# Patient Record
Sex: Female | Born: 1950
Health system: Southern US, Community
[De-identification: ages and names within clinical notes are randomized; demographics above are authoritative.]

## PROBLEM LIST (undated history)

## (undated) DIAGNOSIS — I2699 Other pulmonary embolism without acute cor pulmonale: Secondary | ICD-10-CM

## (undated) HISTORY — PX: OTHER SURGICAL HISTORY: SHX169

## (undated) HISTORY — PX: ABDOMINAL HYSTERECTOMY: SHX81

---

## 2014-10-10 ENCOUNTER — Emergency Department (HOSPITAL_BASED_OUTPATIENT_CLINIC_OR_DEPARTMENT_OTHER)
Admission: EM | Admit: 2014-10-10 | Discharge: 2014-10-10 | Disposition: A | Discharge status: Home / Self Care | Payer: Self-pay | Attending: Emergency Medicine | Admitting: Emergency Medicine

## 2014-10-10 ENCOUNTER — Emergency Department (HOSPITAL_BASED_OUTPATIENT_CLINIC_OR_DEPARTMENT_OTHER): Payer: Self-pay

## 2014-10-10 ENCOUNTER — Encounter (HOSPITAL_BASED_OUTPATIENT_CLINIC_OR_DEPARTMENT_OTHER): Payer: Self-pay | Admitting: *Deleted

## 2014-10-10 DIAGNOSIS — M542 Cervicalgia: Secondary | ICD-10-CM | POA: Insufficient documentation

## 2014-10-10 DIAGNOSIS — M25511 Pain in right shoulder: Secondary | ICD-10-CM | POA: Insufficient documentation

## 2014-10-10 DIAGNOSIS — Z86711 Personal history of pulmonary embolism: Secondary | ICD-10-CM | POA: Insufficient documentation

## 2014-10-10 HISTORY — DX: Other pulmonary embolism without acute cor pulmonale: I26.99

## 2014-10-10 MED ORDER — HYDROCODONE-ACETAMINOPHEN 5-325 MG PO TABS: 1.0000 | ORAL_TABLET | Freq: Four times a day (QID) | ORAL | Status: DC | PRN

## 2014-10-10 NOTE — ED Provider Notes (Signed)
CSN: [id number]     Arrival date & time 10/10/14  1322 History   First MD Initiated Contact with Patient 10/10/14 1336     Chief Complaint  Patient presents with  . Neck Pain     (Consider location/radiation/quality/duration/timing/severity/associated sxs/prior Treatment) HPI Comments: Patient is a 64 year old female with history of embolism. She presents with complaints of right shoulder pain. She denies any injury or trauma. This started 1 week ago and the back of her neck and is now in her right shoulder. Her pain is worse with movement and range of motion. She finds it difficult to sleep at night due to the pain. She denies any numbness or tingling. She denies any chest pain or cough.  Patient is a 64 y.o. female presenting with shoulder pain. The history is provided by the patient.  Shoulder Pain Location:  Shoulder Time since incident:  1 week Shoulder location:  R shoulder Pain details:    Quality:  Sharp   Radiates to:  Does not radiate   Severity:  Severe   Onset quality:  Gradual   Duration:  1 week   Timing:  Constant   Progression:  Worsening Chronicity:  New Relieved by:  Nothing Worsened by:  Movement Ineffective treatments:  None tried   Past Medical History  Diagnosis Date  . PE (pulmonary embolism)    Past Surgical History  Procedure Laterality Date  . Abdominal hysterectomy    . Clot filter     No family history on file. History  Substance Use Topics  . Smoking status: Never Smoker   . Smokeless tobacco: Not on file  . Alcohol Use: No   OB History    No data available     Review of Systems  All other systems reviewed and are negative.     Allergies  Review of patient's allergies indicates no known allergies.  Home Medications   Prior to Admission medications   Not on File   BP 160/80 mmHg  Pulse 85  Temp(Src) 98.2 F (36.8 C) (Oral)  Resp 20  Ht 5' 5.75" (1.67 m)  Wt 200 lb (90.719 kg)  BMI 32.53 kg/m2  SpO2 99% Physical Exam   Constitutional: She is oriented to person, place, and time. She appears well-developed and well-nourished. No distress.  HENT:  Head: Normocephalic and atraumatic.  Neck: Normal range of motion. Neck supple.  Cardiovascular: Normal rate and regular rhythm.  Exam reveals no gallop and no friction rub.   No murmur heard. Pulmonary/Chest: Effort normal and breath sounds normal. No respiratory distress. She has no wheezes.  Abdominal: Soft. Bowel sounds are normal. She exhibits no distension. There is no tenderness.  Musculoskeletal: Normal range of motion.  There is tenderness to palpation over the deltoid muscle and into the trapezius muscles. There is pain with abduction and external rotation. Distal or and radial pulses are easily palpable. She is able to flex and extend all fingers. Sensation is intact to the entire hand  Neurological: She is alert and oriented to person, place, and time.  Skin: Skin is warm and dry. She is not diaphoretic.  Nursing note and vitals reviewed.   ED Course  Procedures (including critical care time) Labs Review Labs Reviewed - No data to display  Imaging Review No results found.   EKG Interpretation None      MDM   Final diagnoses:  None    Pain with palpation and range of motion of the right shoulder.  This may  well be a rotator cuff tendinitis. She is tender in the posterior aspect of the shoulder and shoulder blade and has pain with range of motion especially with abduction and external rotation. The arm is neurovascularly intact. She will be placed in a shoulder sling and treated with anti-inflammatories, pain meds, and when necessary return.    Geoffery Lyonsouglas Tatym Schermer, MD 10/10/14 [phone]

## 2014-10-10 NOTE — Discharge Instructions (Signed)
Ibuprofen 600 mg times daily for the next 5 days.  Hydrocodone as prescribed as needed for pain not relieved with ibuprofen.  Wear arm sling as applied for the next several days for comfort.   Shoulder Pain The shoulder is the joint that connects your arms to your body. The bones that form the shoulder joint include the upper arm bone (humerus), the shoulder blade (scapula), and the collarbone (clavicle). The top of the humerus is shaped like a ball and fits into a rather flat socket on the scapula (glenoid cavity). A combination of muscles and strong, fibrous tissues that connect muscles to bones (tendons) support your shoulder joint and hold the ball in the socket. Small, fluid-filled sacs (bursae) are located in different areas of the joint. They act as cushions between the bones and the overlying soft tissues and help reduce friction between the gliding tendons and the bone as you move your arm. Your shoulder joint allows a wide range of motion in your arm. This range of motion allows you to do things like scratch your back or throw a ball. However, this range of motion also makes your shoulder more prone to pain from overuse and injury. Causes of shoulder pain can originate from both injury and overuse and usually can be grouped in the following four categories:  Redness, swelling, and pain (inflammation) of the tendon (tendinitis) or the bursae (bursitis).  Instability, such as a dislocation of the joint.  Inflammation of the joint (arthritis).  Broken bone (fracture). HOME CARE INSTRUCTIONS   Apply ice to the sore area.  Put ice in a plastic bag.  Place a towel between your skin and the bag.  Leave the ice on for 15-20 minutes, 3-4 times per day for the first 2 days, or as directed by your health care provider.  Stop using cold packs if they do not help with the pain.  If you have a shoulder sling or immobilizer, wear it as long as your caregiver instructs. Only remove it to  shower or bathe. Move your arm as little as possible, but keep your hand moving to prevent swelling.  Squeeze a soft ball or foam pad as much as possible to help prevent swelling.  Only take over-the-counter or prescription medicines for pain, discomfort, or fever as directed by your caregiver. SEEK MEDICAL CARE IF:   Your shoulder pain increases, or new pain develops in your arm, hand, or fingers.  Your hand or fingers become cold and numb.  Your pain is not relieved with medicines. SEEK IMMEDIATE MEDICAL CARE IF:   Your arm, hand, or fingers are numb or tingling.  Your arm, hand, or fingers are significantly swollen or turn white or blue. MAKE SURE YOU:   Understand these instructions.  Will watch your condition.  Will get help right away if you are not doing well or get worse. Document Released: 03/27/2005 Document Revised: 11/01/2013 Document Reviewed: 06/01/2011 [facility] Patient Information 2015 [employer], [place]. This information is not intended to replace advice given to you by your health care provider. Make sure you discuss any questions you have with your health care provider.

## 2014-10-10 NOTE — ED Notes (Signed)
Pain in her neck and right shoulder for a week. She has been taking Aleve.

## 2016-09-02 ENCOUNTER — Emergency Department (HOSPITAL_BASED_OUTPATIENT_CLINIC_OR_DEPARTMENT_OTHER)
Admission: EM | Admit: 2016-09-02 | Discharge: 2016-09-02 | Disposition: A | Discharge status: Home / Self Care | Payer: Self-pay | Attending: Emergency Medicine | Admitting: Emergency Medicine

## 2016-09-02 ENCOUNTER — Emergency Department (HOSPITAL_BASED_OUTPATIENT_CLINIC_OR_DEPARTMENT_OTHER): Payer: Self-pay

## 2016-09-02 ENCOUNTER — Encounter (HOSPITAL_BASED_OUTPATIENT_CLINIC_OR_DEPARTMENT_OTHER): Payer: Self-pay | Admitting: Emergency Medicine

## 2016-09-02 DIAGNOSIS — R509 Fever, unspecified: Secondary | ICD-10-CM | POA: Insufficient documentation

## 2016-09-02 DIAGNOSIS — R51 Headache: Secondary | ICD-10-CM | POA: Insufficient documentation

## 2016-09-02 DIAGNOSIS — M791 Myalgia: Secondary | ICD-10-CM | POA: Insufficient documentation

## 2016-09-02 DIAGNOSIS — R0602 Shortness of breath: Secondary | ICD-10-CM | POA: Insufficient documentation

## 2016-09-02 DIAGNOSIS — R05 Cough: Secondary | ICD-10-CM | POA: Insufficient documentation

## 2016-09-02 DIAGNOSIS — R0981 Nasal congestion: Secondary | ICD-10-CM | POA: Insufficient documentation

## 2016-09-02 DIAGNOSIS — R5383 Other fatigue: Secondary | ICD-10-CM | POA: Insufficient documentation

## 2016-09-02 DIAGNOSIS — R6889 Other general symptoms and signs: Secondary | ICD-10-CM

## 2016-09-02 MED ORDER — DM-GUAIFENESIN ER 30-600 MG PO TB12: 1.0000 | ORAL_TABLET | Freq: Two times a day (BID) | ORAL | 1 refills | Status: DC

## 2016-09-02 NOTE — ED Provider Notes (Signed)
[facility]   CSN: [id number] Arrival date & time: 09/02/16  1051     History   Chief Complaint Chief Complaint  Patient presents with  . Cough    HPI Erica Morse is a 66 y.o. female.  Patient with one-week history of flulike symptoms. Started with bodyaches headache fever sore throat fatigue and went into cough and congestion. Patient's has had a rattle in her chest the past few days. Over-the-counter medicines are not helping. No significant problem with nausea vomiting or diarrhea.      Past Medical History:  Diagnosis Date  . PE (pulmonary embolism)     There are no active problems to display for this patient.   Past Surgical History:  Procedure Laterality Date  . ABDOMINAL HYSTERECTOMY    . clot filter      OB History    No data available       Home Medications    Prior to Admission medications   Medication Sig Start Date End Date Taking? Authorizing Provider  dextromethorphan-guaiFENesin (MUCINEX DM) 30-600 MG 12hr tablet Take 1 tablet by mouth 2 (two) times daily. 09/02/16   Vanetta MuldersScott Alix Lahmann, MD  HYDROcodone-acetaminophen (NORCO) 5-325 MG per tablet Take 1-2 tablets by mouth every 6 (six) hours as needed. 10/10/14   Geoffery Lyonsouglas Delo, MD    Family History History reviewed. No pertinent family history.  Social History Social History  Substance Use Topics  . Smoking status: Never Smoker  . Smokeless tobacco: Never Used  . Alcohol use No     Allergies   Patient has no known allergies.   Review of Systems Review of Systems  Constitutional: Positive for fatigue and fever.  HENT: Positive for congestion.   Eyes: Negative for redness.  Respiratory: Positive for cough and shortness of breath.   Cardiovascular: Negative for chest pain.  Gastrointestinal: Negative for abdominal pain.  Genitourinary: Negative for dysuria.  Musculoskeletal: Positive for myalgias.  Skin: Negative for rash.  Neurological: Positive for  headaches.  Hematological: Does not bruise/bleed easily.  Psychiatric/Behavioral: Negative for confusion.     Physical Exam Updated Vital Signs BP 159/92 (BP Location: Left Arm)   Pulse 96   Temp 98.1 F (36.7 C) (Oral)   Resp 20   Ht 5' 5.75" (1.67 m)   Wt 90.7 kg   SpO2 100%   BMI 32.53 kg/m   Physical Exam  Constitutional: She is oriented to person, place, and time. She appears well-developed and well-nourished. No distress.  HENT:  Head: Normocephalic and atraumatic.  Mouth/Throat: Oropharynx is clear and moist.  Eyes: Conjunctivae and EOM are normal. Pupils are equal, round, and reactive to light.  Neck: Normal range of motion. Neck supple.  Cardiovascular: Normal rate and regular rhythm.   Pulmonary/Chest: Effort normal and breath sounds normal. No respiratory distress.  Abdominal: Soft. Bowel sounds are normal. There is no tenderness.  Musculoskeletal: Normal range of motion.  Neurological: She is alert and oriented to person, place, and time. No cranial nerve deficit or sensory deficit. She exhibits normal muscle tone. Coordination normal.  Skin: Skin is warm.  Nursing note and vitals reviewed.    ED Treatments / Results  Labs (all labs ordered are listed, but only abnormal results are displayed) Labs Reviewed - No data to display  EKG  EKG Interpretation None       Radiology Dg Chest 2 View  Result Date: 09/02/2016 CLINICAL DATA:  Cough, fever, and headache for the past week. Nonsmoker. History  of pulmonary embolism. EXAM: CHEST  2 VIEW COMPARISON:  None impacts FINDINGS: The lungs are adequately inflated and clear. The heart and pulmonary vascularity are normal. The mediastinum is normal in width. There is faint calcification in the wall of the aortic arch. There is no pleural effusion. There is gentle dextrocurvature of the thoracolumbar spine. IMPRESSION: There is no pneumonia nor other active cardiopulmonary disease. Electronically Signed   By: David   [place] M.D.   On: 09/02/2016 11:26    Procedures Procedures (including critical care time)  Medications Ordered in ED Medications - No data to display   Initial Impression / Assessment and Plan / ED Course  I have reviewed the triage vital signs and the nursing notes.  Pertinent labs & imaging results that were available during my care of the patient were reviewed by me and considered in my medical decision making (see chart for details).     Patient with symptoms consistent with the flu starting a week ago. Now with persisting cough. Chest x-ray negative for pneumonia. Patient's oxygen saturations are 100% on room air. Patient nontoxic no acute distress. Will treat symptomatically. Patient's out of the window for Tamiflu.  Final Clinical Impressions(s) / ED Diagnoses   Final diagnoses:  Flu-like symptoms    New Prescriptions New Prescriptions   DEXTROMETHORPHAN-GUAIFENESIN (MUCINEX DM) 30-600 MG 12HR TABLET    Take 1 tablet by mouth 2 (two) times daily.     Vanetta Mulders, MD 09/02/16 [phone]

## 2016-09-02 NOTE — Discharge Instructions (Signed)
Chest x-ray negative for pneumonia. Symptoms consistent with influenza. Take the Mucinex DM as directed. Return for any new or worse symptoms.

## 2016-09-02 NOTE — ED Triage Notes (Signed)
Patient states that she has had a cough x 1 week, and has been taking medication OTC at home. Patient states that she "have a rattle in my chest" - patient also reports that she is having nausea, chills and aches and other flu like symptoms

## 2016-09-12 IMAGING — DX DG SHOULDER 2+V*R*
3 series · 3 of 3 positions shown · non-contrast
Comparison: None.

CLINICAL DATA: Neck pain, right shoulder pain

EXAM:
RIGHT SHOULDER - 2+ VIEW

[shoulder grashey]
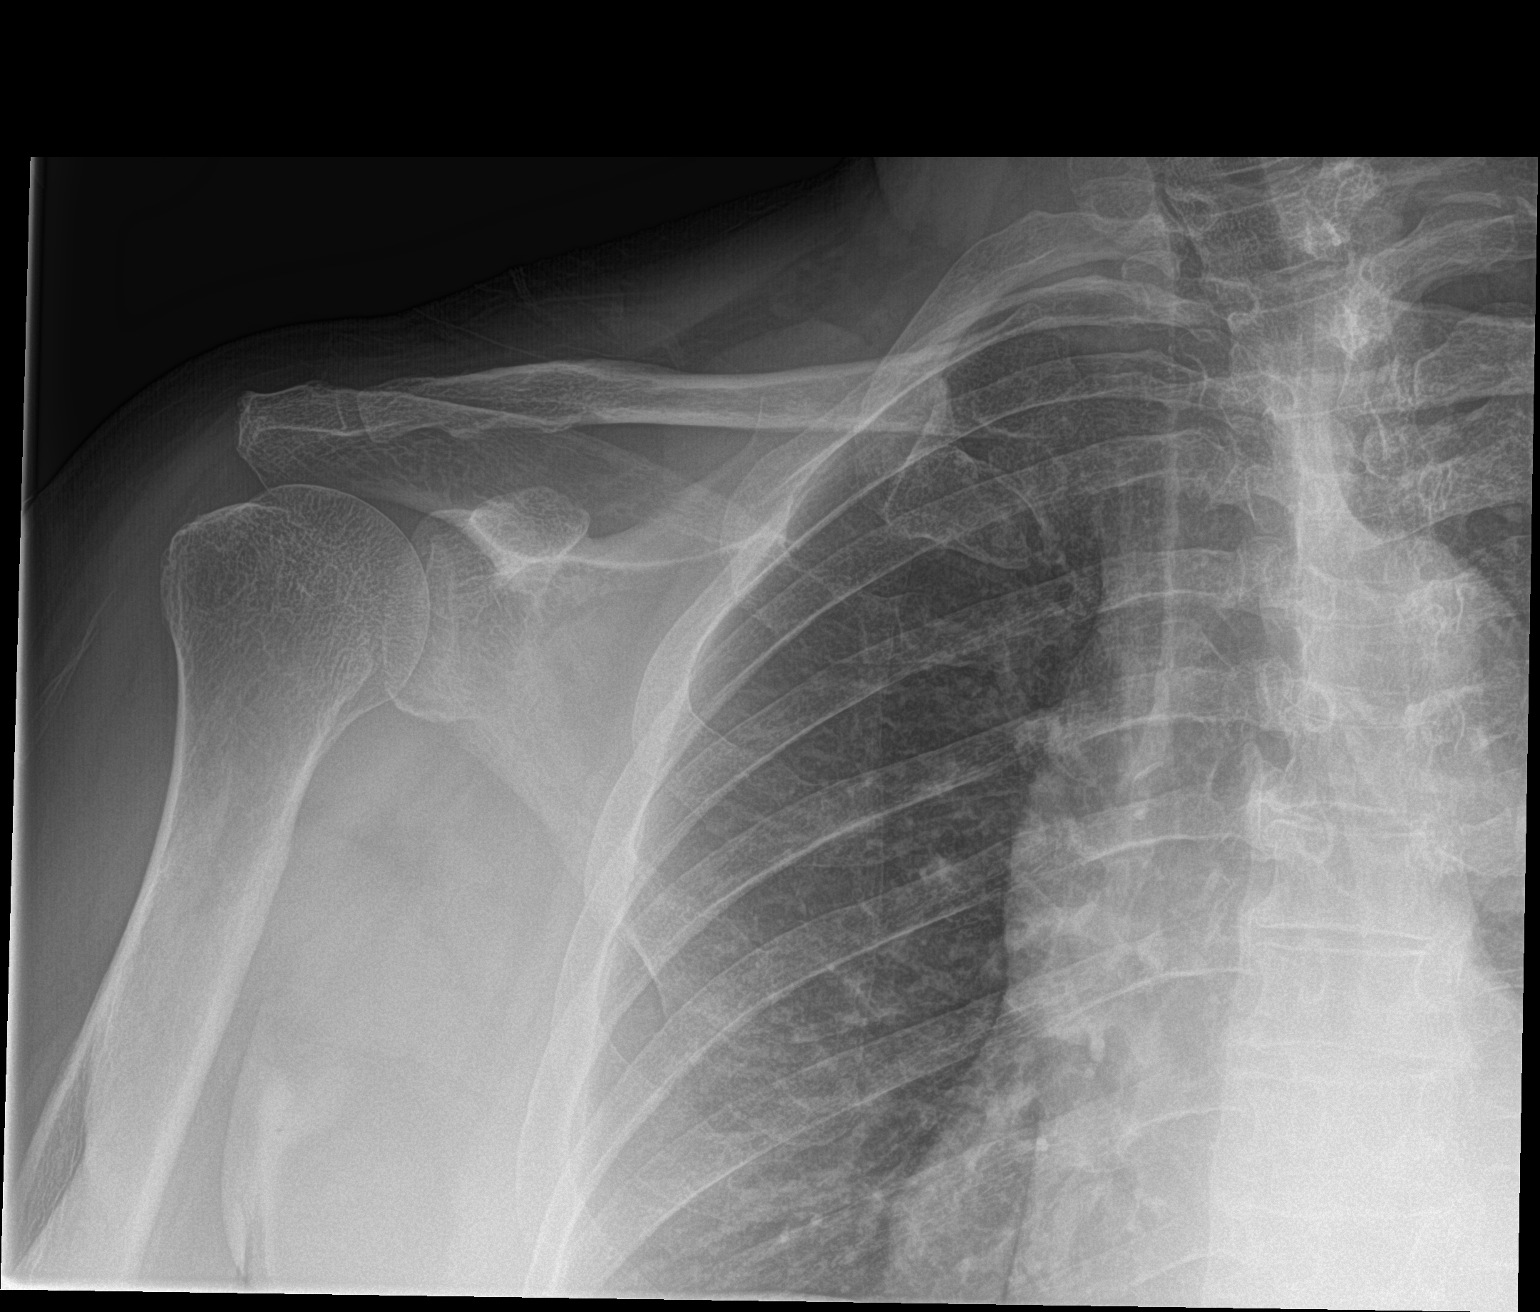

[shoulder y view]
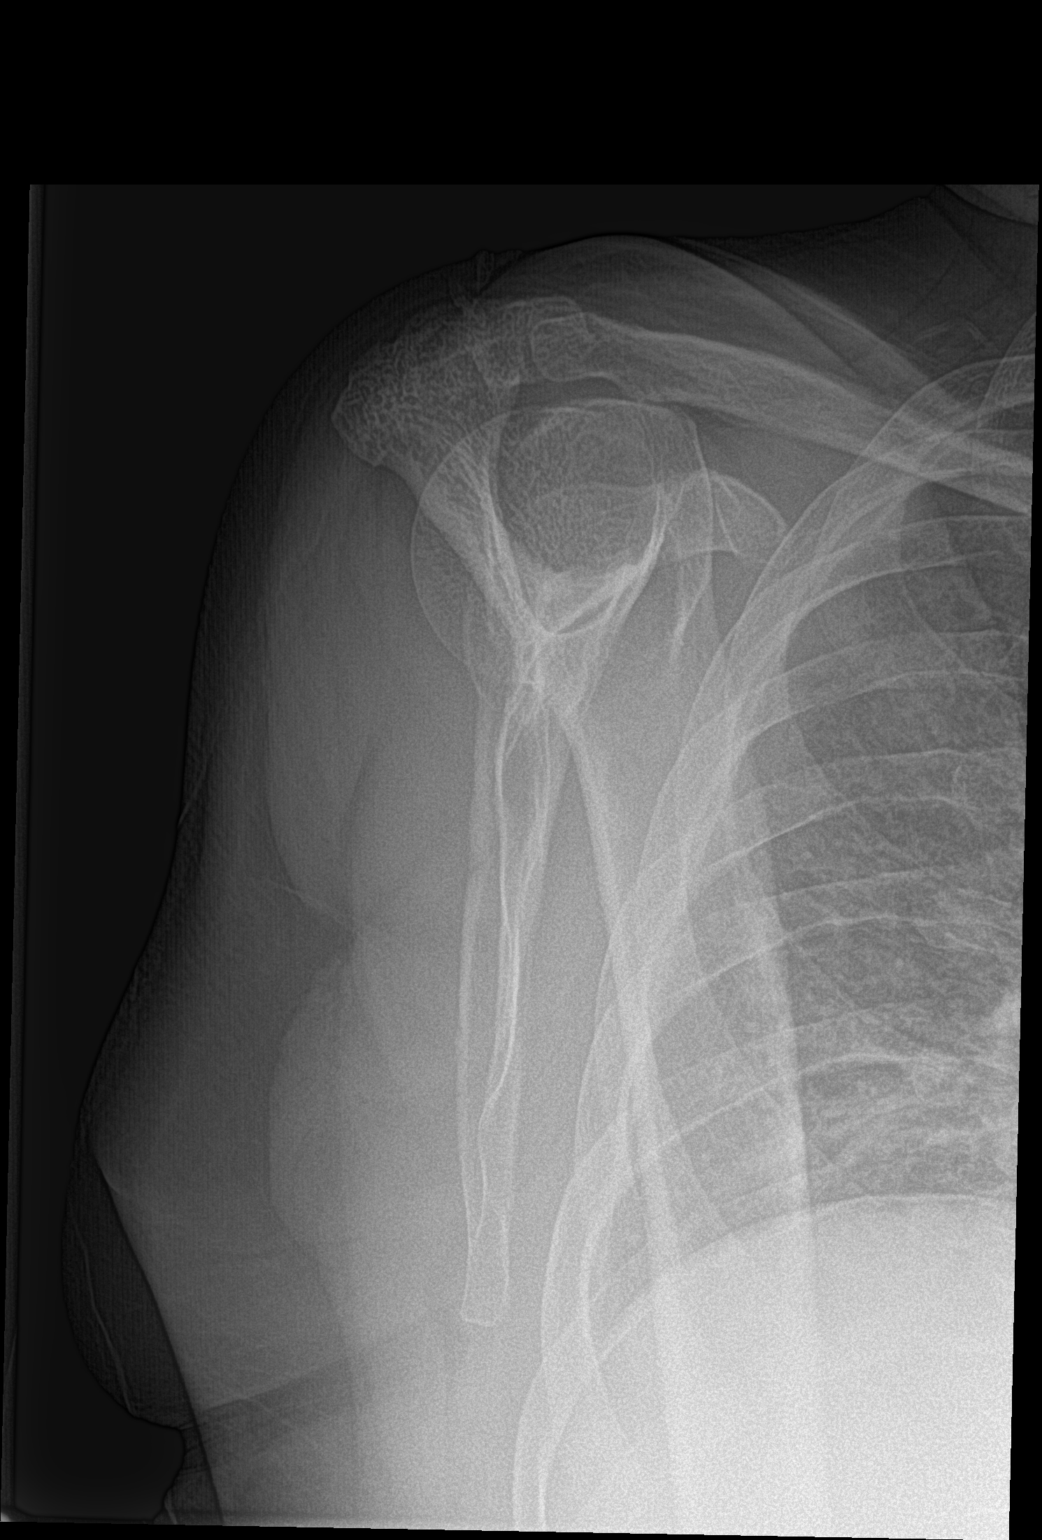

[shoulder axillary]
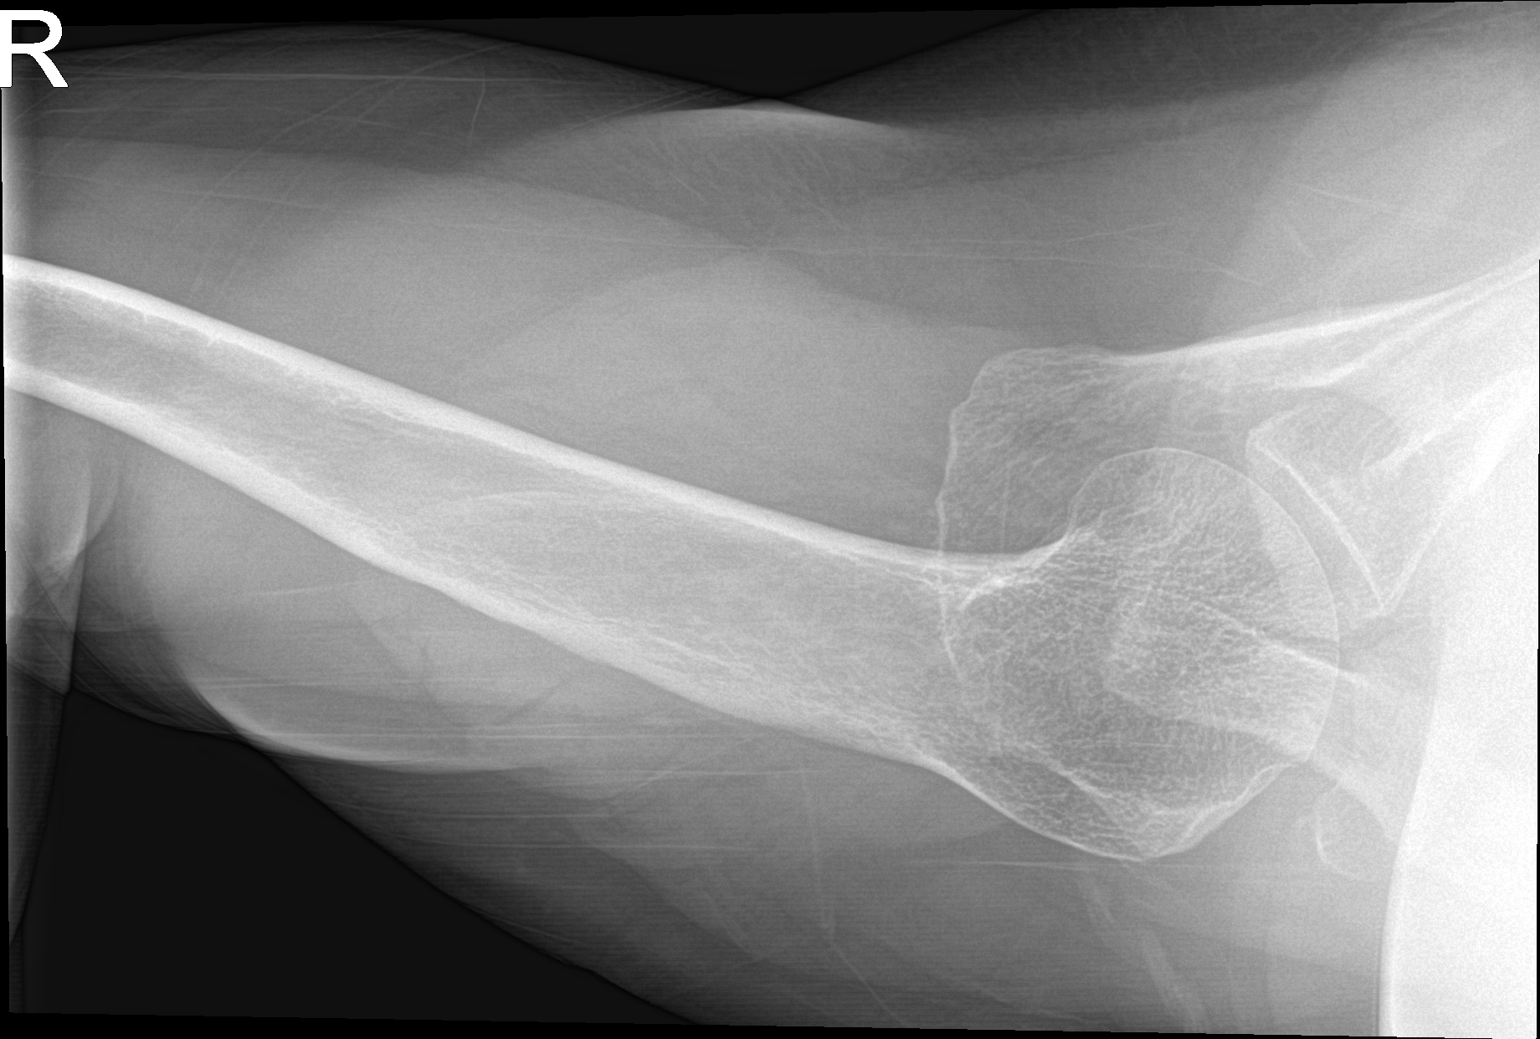

[3 of 3 positions shown; findings below may reference images not displayed]

FINDINGS: Three views of the right shoulder submitted. No acute fracture or
subluxation. Minimal degenerative changes AC joint. Glenohumeral
joint is preserved.
IMPRESSION: No acute fracture or subluxation. Minimal degenerative changes
acromioclavicular joint.

## 2018-05-06 ENCOUNTER — Other Ambulatory Visit: Payer: Self-pay

## 2018-05-06 ENCOUNTER — Emergency Department (HOSPITAL_BASED_OUTPATIENT_CLINIC_OR_DEPARTMENT_OTHER)
Admission: EM | Admit: 2018-05-06 | Discharge: 2018-05-06 | Disposition: A | Discharge status: Home / Self Care | Payer: Medicare Other | Attending: Emergency Medicine | Admitting: Emergency Medicine

## 2018-05-06 ENCOUNTER — Encounter (HOSPITAL_BASED_OUTPATIENT_CLINIC_OR_DEPARTMENT_OTHER): Payer: Self-pay | Admitting: Emergency Medicine

## 2018-05-06 DIAGNOSIS — S161XXA Strain of muscle, fascia and tendon at neck level, initial encounter: Secondary | ICD-10-CM | POA: Diagnosis not present

## 2018-05-06 DIAGNOSIS — Y939 Activity, unspecified: Secondary | ICD-10-CM | POA: Insufficient documentation

## 2018-05-06 DIAGNOSIS — Y999 Unspecified external cause status: Secondary | ICD-10-CM | POA: Insufficient documentation

## 2018-05-06 DIAGNOSIS — S199XXA Unspecified injury of neck, initial encounter: Secondary | ICD-10-CM | POA: Diagnosis present

## 2018-05-06 DIAGNOSIS — Y929 Unspecified place or not applicable: Secondary | ICD-10-CM | POA: Insufficient documentation

## 2018-05-06 MED ORDER — METHOCARBAMOL 500 MG PO TABS: 500.0000 mg | ORAL_TABLET | Freq: Three times a day (TID) | ORAL | 0 refills | Status: DC | PRN

## 2018-05-06 MED ORDER — IBUPROFEN 600 MG PO TABS: 600.0000 mg | ORAL_TABLET | Freq: Three times a day (TID) | ORAL | 0 refills | Status: DC | PRN
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
## 2018-05-06 NOTE — ED Notes (Signed)
A lump to left upper back noted and c/o pain to this site.
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
## 2018-08-06 IMAGING — CR DG CHEST 2V
2 series · 2 of 2 positions shown · non-contrast
Comparison: None impacts

CLINICAL DATA: Cough, fever, and headache for the past week.
Nonsmoker. History of pulmonary embolism.

EXAM:
CHEST  2 VIEW

[w chest pa]
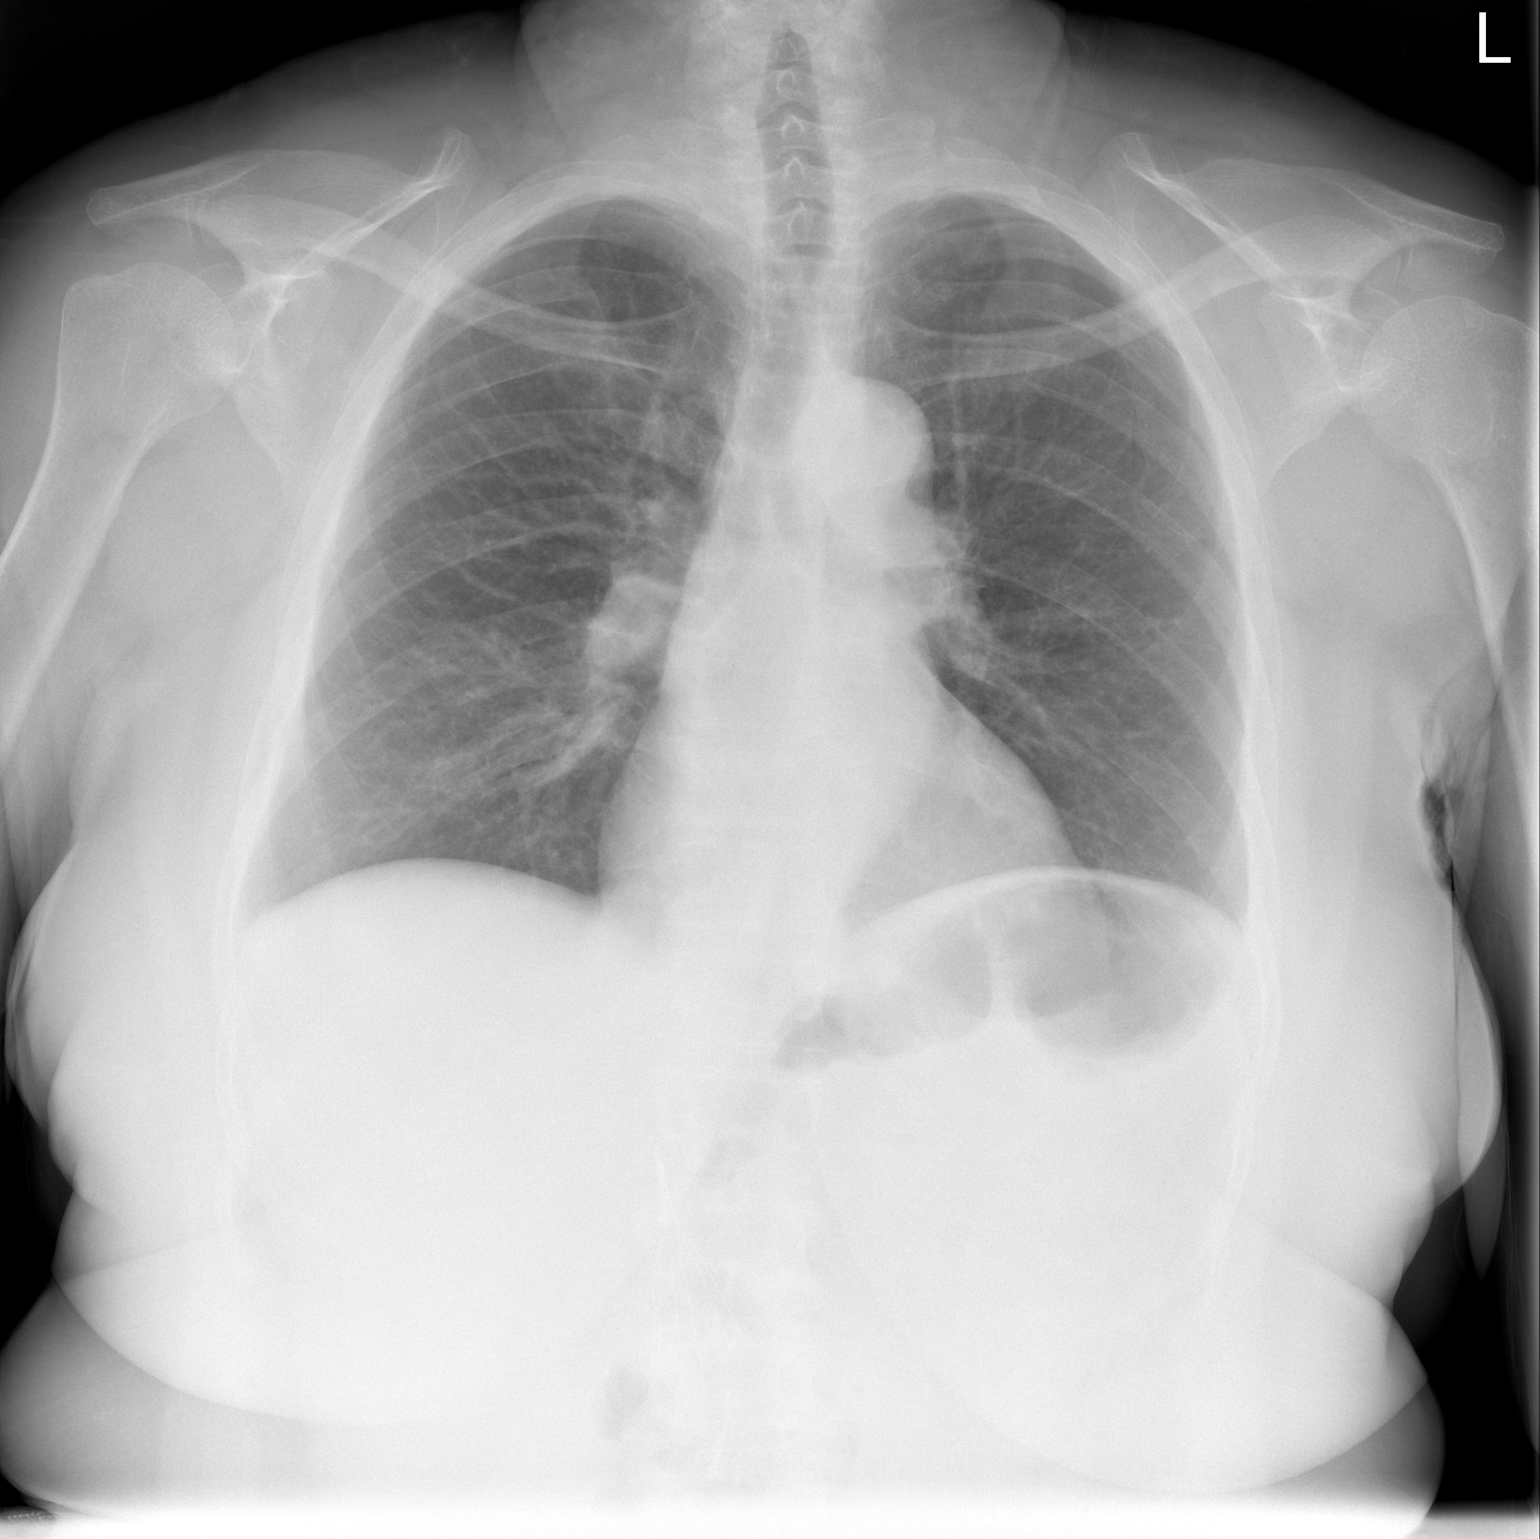

[w chest lat]
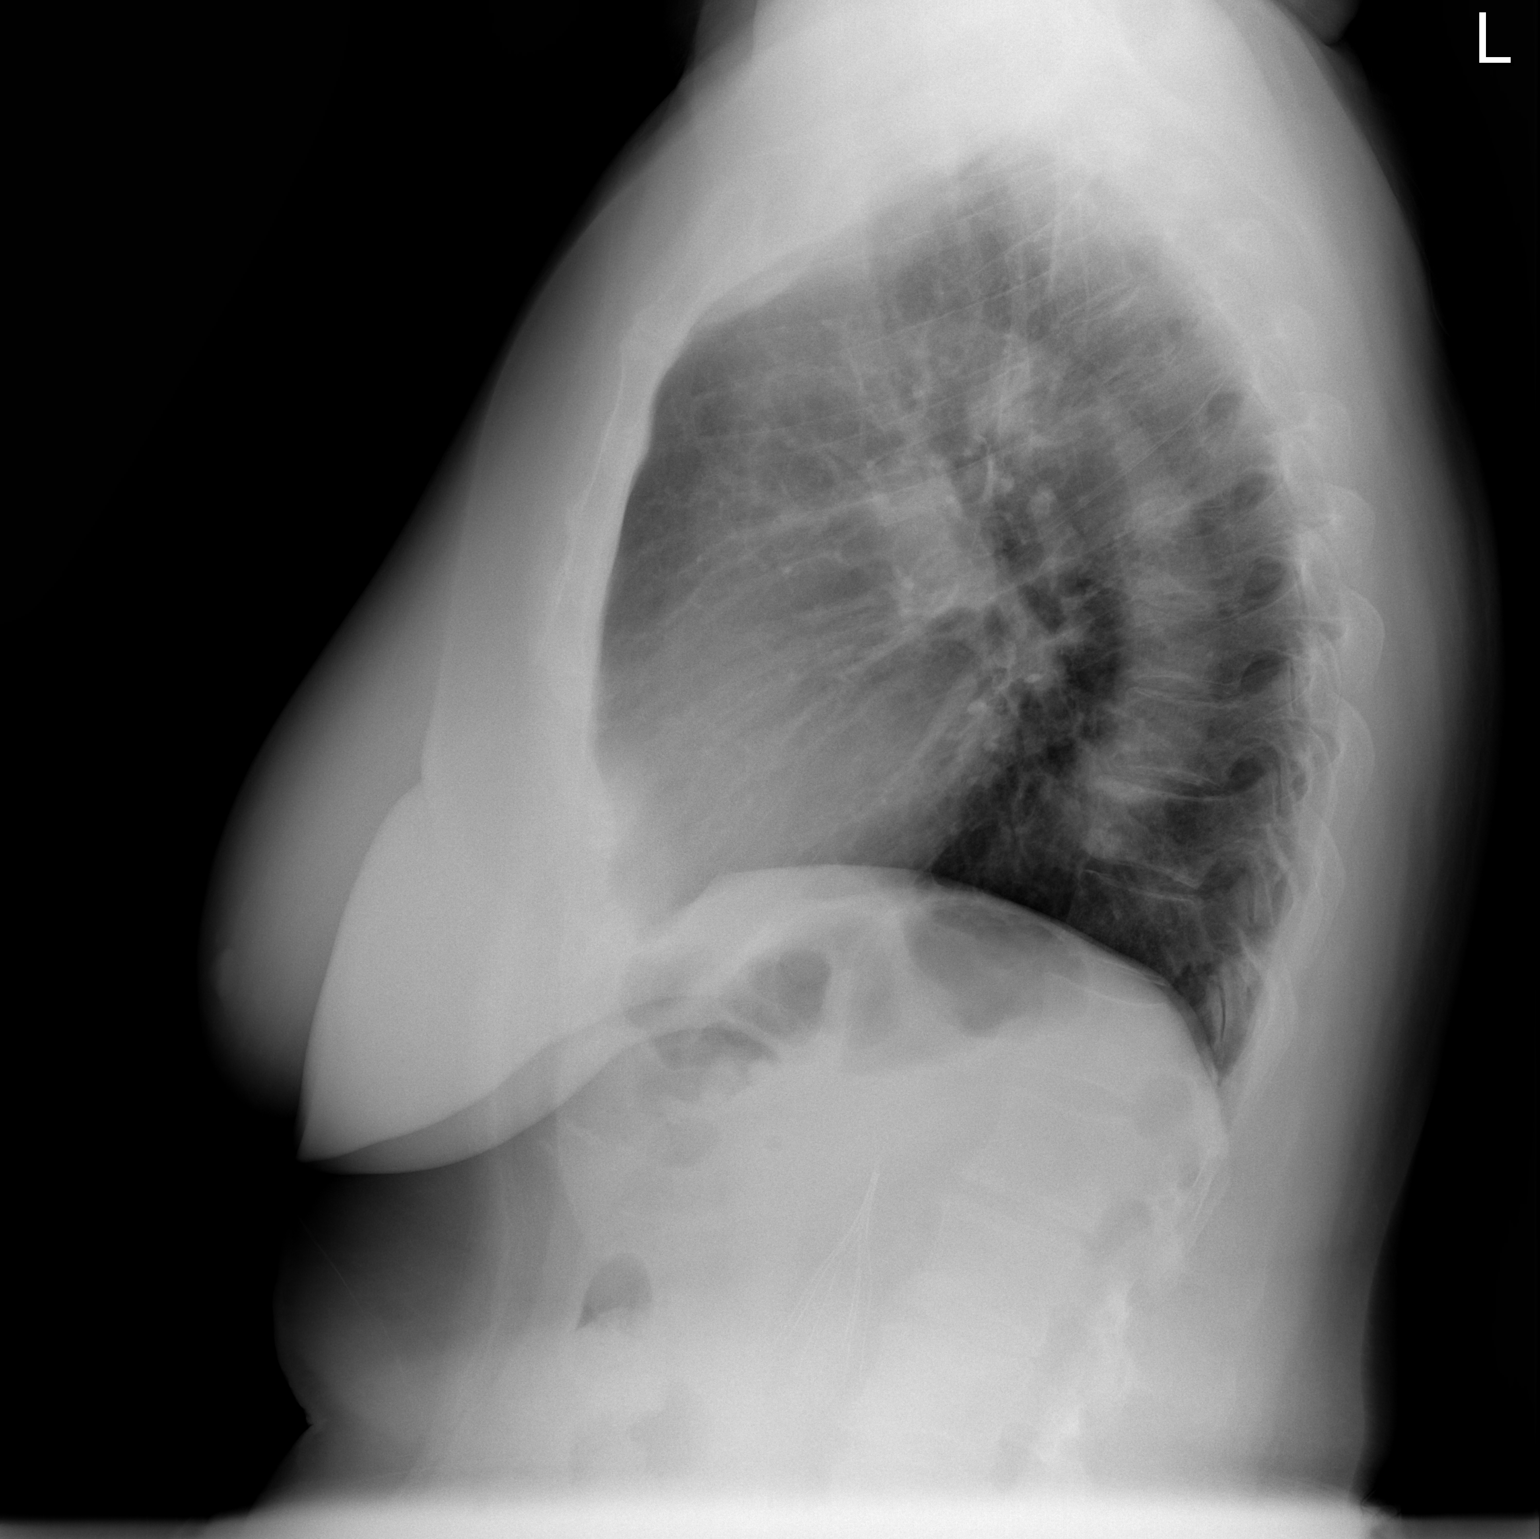

[2 of 2 positions shown; findings below may reference images not displayed]

FINDINGS: The lungs are adequately inflated and clear. The heart and pulmonary
vascularity are normal. The mediastinum is normal in width. There is
faint calcification in the wall of the aortic arch. There is no
pleural effusion. There is gentle dextrocurvature of the
thoracolumbar spine.
IMPRESSION: There is no pneumonia nor other active cardiopulmonary disease.

## 2019-01-23 ENCOUNTER — Encounter (HOSPITAL_BASED_OUTPATIENT_CLINIC_OR_DEPARTMENT_OTHER): Payer: Self-pay | Admitting: Emergency Medicine

## 2019-01-23 ENCOUNTER — Emergency Department (HOSPITAL_BASED_OUTPATIENT_CLINIC_OR_DEPARTMENT_OTHER)
Admission: EM | Admit: 2019-01-23 | Discharge: 2019-01-23 | Disposition: A | Discharge status: Home / Self Care | Payer: Medicare Other | Attending: Emergency Medicine | Admitting: Emergency Medicine

## 2019-01-23 ENCOUNTER — Other Ambulatory Visit: Payer: Self-pay

## 2019-01-23 DIAGNOSIS — R110 Nausea: Secondary | ICD-10-CM | POA: Insufficient documentation

## 2019-01-23 LAB — COMPREHENSIVE METABOLIC PANEL
ALT: 23 U/L (ref 0–44)
AST: 30 U/L (ref 15–41)
Albumin: 3.9 g/dL (ref 3.5–5.0)
Alkaline Phosphatase: 127 U/L — ABNORMAL HIGH (ref 38–126)
Anion gap: 9 (ref 5–15)
BUN: 12 mg/dL (ref 8–23)
CO2: 24 mmol/L (ref 22–32)
Calcium: 9.3 mg/dL (ref 8.9–10.3)
Chloride: 106 mmol/L (ref 98–111)
Creatinine, Ser: 0.96 mg/dL (ref 0.44–1.00)
GFR calc Af Amer: >60 mL/min (ref >60)
GFR calc non Af Amer: >60 mL/min (ref >60)
Glucose, Bld: 142 mg/dL — ABNORMAL HIGH (ref 70–99)
Potassium: 3.5 mmol/L (ref 3.5–5.1)
Sodium: 139 mmol/L (ref 135–145)
Total Bilirubin: 0.9 mg/dL (ref 0.3–1.2)
Total Protein: 7 g/dL (ref 6.5–8.1)

## 2019-01-23 LAB — CBC WITH DIFFERENTIAL/PLATELET
Abs Immature Granulocytes: 0.01 10*3/uL (ref 0.00–0.07)
Basophils Absolute: 0.1 10*3/uL (ref 0.0–0.1)
Basophils Relative: 1 %
Eosinophils Absolute: 0.3 10*3/uL (ref 0.0–0.5)
Eosinophils Relative: 4 %
HCT: 40.3 % (ref 36.0–46.0)
Hemoglobin: 12.8 g/dL (ref 12.0–15.0)
Immature Granulocytes: 0 %
Lymphocytes Relative: 42 %
Lymphs Abs: 3 10*3/uL (ref 0.7–4.0)
MCH: 30.4 pg (ref 26.0–34.0)
MCHC: 31.8 g/dL (ref 30.0–36.0)
MCV: 95.7 fL (ref 80.0–100.0)
Monocytes Absolute: 0.6 10*3/uL (ref 0.1–1.0)
Monocytes Relative: 9 %
Neutro Abs: 3.1 10*3/uL (ref 1.7–7.7)
Neutrophils Relative %: 44 %
Platelets: 231 10*3/uL (ref 150–400)
RBC: 4.21 MIL/uL (ref 3.87–5.11)
RDW: 13.3 % (ref 11.5–15.5)
WBC: 7 10*3/uL (ref 4.0–10.5)
nRBC: 0 % (ref 0.0–0.2)

## 2019-01-23 LAB — URINALYSIS, ROUTINE W REFLEX MICROSCOPIC
Bilirubin Urine: NEGATIVE
Glucose, UA: NEGATIVE mg/dL
Hgb urine dipstick: NEGATIVE
Ketones, ur: NEGATIVE mg/dL
Leukocytes,Ua: NEGATIVE
Nitrite: NEGATIVE
Protein, ur: NEGATIVE mg/dL
Specific Gravity, Urine: <1.005 — ABNORMAL LOW (ref 1.005–1.030)
pH: 6 (ref 5.0–8.0)

## 2019-01-23 MED ORDER — ONDANSETRON 8 MG PO TBDP
8.0000 mg | ORAL_TABLET | Freq: Once | ORAL | Status: AC
  Administered 2019-01-23: 8 mg via ORAL
  Filled 2019-01-23: qty 1

## 2019-01-23 MED ORDER — PROMETHAZINE HCL 25 MG PO TABS
25.0000 mg | ORAL_TABLET | Freq: Four times a day (QID) | ORAL | 0 refills | Status: AC | PRN
Start: 2019-01-23 — End: ?

## 2019-01-23 NOTE — ED Provider Notes (Signed)
[facility] Provider Note: Lowella DellJ. Lane Cherye Gaertner, MD, FACEP  CSN: [id number] MRN: [id number] ARRIVAL: 01/23/19 at 0522 ROOM: [facility]09/[facility]09   CHIEF COMPLAINT  Nausea   HISTORY OF PRESENT ILLNESS  01/23/19 5:43 AM Erica Morse is a 68 y.o. female with a 2-day history of nausea.  She describes this as a sensation that she needs to vomit but is unable to vomit.  It is worse when she gets up in the morning and is better when she lies down at night.  Symptoms are moderate.  She denies associated fever, chills, abdominal pain, vomiting or diarrhea.  She denies urinary symptoms or vaginal bleeding or discharge.  She denies change with movement of her head.  She has had vertigo in the past and does not feel that this is vertigo.   Past Medical History:  Diagnosis Date  . PE (pulmonary embolism)     Past Surgical History:  Procedure Laterality Date  . ABDOMINAL HYSTERECTOMY    . clot filter      No family history on file.  Social History   Tobacco Use  . Smoking status: Never Smoker  . Smokeless tobacco: Never Used  Substance Use Topics  . Alcohol use: No  . Drug use: No    Prior to Admission medications   Medication Sig Start Date End Date Taking? Authorizing Provider  ibuprofen (ADVIL,MOTRIN) 600 MG tablet Take 1 tablet (600 mg total) by mouth every 8 (eight) hours as needed. 05/06/18   Azalia Bilisampos, Kevin, MD  methocarbamol (ROBAXIN) 500 MG tablet Take 1 tablet (500 mg total) by mouth every 8 (eight) hours as needed for muscle spasms. 05/06/18   Azalia Bilisampos, Kevin, MD    Allergies Fosamax [alendronate sodium] and Tetracyclines & related   REVIEW OF SYSTEMS  Negative except as noted here or in the History of Present Illness.   PHYSICAL EXAMINATION  Initial Vital Signs Blood pressure (!) 150/80, pulse 67, temperature 98 F (36.7 C), temperature source Oral, resp. rate 18, height 5\' 5"  (1.651 m), weight 89.8 kg, SpO2 99 %.  Examination General: Well-developed,  well-nourished female in no acute distress; appearance consistent with age of record HENT: normocephalic; atraumatic; movement of head does not cause nausea or vertigo Eyes: pupils equal, round and reactive to light; extraocular muscles intact; arcus senilis bilaterally Neck: supple Heart: regular rate and rhythm Lungs: clear to auscultation bilaterally Abdomen: soft; nondistended; nontender; no masses or hepatosplenomegaly; bowel sounds present Extremities: No deformity; full range of motion; pulses normal Neurologic: Awake, alert and oriented; motor function intact in all extremities and symmetric; no facial droop; normal coordination, speech and gait Skin: Warm and dry Psychiatric: Normal mood and affect   RESULTS  Summary of this visit's results, reviewed by myself:   EKG Interpretation  Date/Time:    Ventricular Rate:    PR Interval:    QRS Duration:   QT Interval:    QTC Calculation:   R Axis:     Text Interpretation:        Laboratory Studies: Results for orders placed or performed during the hospital encounter of 01/23/19 (from the past 24 hour(s))  Urinalysis, Routine w reflex microscopic     Status: Abnormal   Collection Time: 01/23/19  6:00 AM  Result Value Ref Range   Color, Urine YELLOW YELLOW   APPearance CLEAR CLEAR   Specific Gravity, Urine <1.005 (L) 1.005 - 1.030   pH 6.0 5.0 - 8.0   Glucose, UA NEGATIVE NEGATIVE mg/dL   Hgb urine  dipstick NEGATIVE NEGATIVE   Bilirubin Urine NEGATIVE NEGATIVE   Ketones, ur NEGATIVE NEGATIVE mg/dL   Protein, ur NEGATIVE NEGATIVE mg/dL   Nitrite NEGATIVE NEGATIVE   Leukocytes,Ua NEGATIVE NEGATIVE  CBC with Differential/Platelet     Status: None   Collection Time: 01/23/19  6:00 AM  Result Value Ref Range   WBC 7.0 4.0 - 10.5 K/uL   RBC 4.21 3.87 - 5.11 MIL/uL   Hemoglobin 12.8 12.0 - 15.0 g/dL   HCT 40.3 36.0 - 46.0 %   MCV 95.7 80.0 - 100.0 fL   MCH 30.4 26.0 - 34.0 pg   MCHC 31.8 30.0 - 36.0 g/dL   RDW 13.3  11.5 - 15.5 %   Platelets 231 150 - 400 K/uL   nRBC 0.0 0.0 - 0.2 %   Neutrophils Relative % 44 %   Neutro Abs 3.1 1.7 - 7.7 K/uL   Lymphocytes Relative 42 %   Lymphs Abs 3.0 0.7 - 4.0 K/uL   Monocytes Relative 9 %   Monocytes Absolute 0.6 0.1 - 1.0 K/uL   Eosinophils Relative 4 %   Eosinophils Absolute 0.3 0.0 - 0.5 K/uL   Basophils Relative 1 %   Basophils Absolute 0.1 0.0 - 0.1 K/uL   Immature Granulocytes 0 %   Abs Immature Granulocytes 0.01 0.00 - 0.07 K/uL  Comprehensive metabolic panel     Status: Abnormal   Collection Time: 01/23/19  6:00 AM  Result Value Ref Range   Sodium 139 135 - 145 mmol/L   Potassium 3.5 3.5 - 5.1 mmol/L   Chloride 106 98 - 111 mmol/L   CO2 24 22 - 32 mmol/L   Glucose, Bld 142 (H) 70 - 99 mg/dL   BUN 12 8 - 23 mg/dL   Creatinine, Ser 0.96 0.44 - 1.00 mg/dL   Calcium 9.3 8.9 - 10.3 mg/dL   Total Protein 7.0 6.5 - 8.1 g/dL   Albumin 3.9 3.5 - 5.0 g/dL   AST 30 15 - 41 U/L   ALT 23 0 - 44 U/L   Alkaline Phosphatase 127 (H) 38 - 126 U/L   Total Bilirubin 0.9 0.3 - 1.2 mg/dL   GFR calc non Af Amer >60 >60 mL/min   GFR calc Af Amer >60 >60 mL/min   Anion gap 9 5 - 15   Imaging Studies: No results found.  ED COURSE and MDM  Nursing notes and initial vitals signs, including pulse oximetry, reviewed.  Vitals:   01/23/19 0530 01/23/19 0533  BP:  (!) 150/80  Pulse:  67  Resp:  18  Temp:  98 F (36.7 C)  TempSrc:  Oral  SpO2:  99%  Weight: 89.8 kg   Height: 5\' 5"  (1.651 m)    6:27 AM Diagnostic studies are reassuring.  Patient's nausea not improved with Zofran ODT.  Nausea seems to present itself only when ambulatory and not at rest.  We will treat her symptomatically with Phenergan and have her follow-up with her primary care physician if symptoms persist.  PROCEDURES    ED DIAGNOSES     ICD-10-CM   1. Nausea  R11.0        Sonakshi Rolland, MD 01/23/19 [phone]

## 2019-01-23 NOTE — ED Triage Notes (Signed)
Pt states that she started feeling nauseous a few days ago. It has continued. States that if she is still it subsides. Denies vomiting, fever, or diarrhea. Denies urinary symptoms or vaginal discharge or bleeding

## 2019-01-23 NOTE — ED Notes (Signed)
ED Provider at bedside. 

## 2019-01-23 NOTE — ED Notes (Signed)
Pt understood dc material. NAD Noted. Script given at dc. All questions answered to satisfaction. Pt escorted to check out window. 

## 2019-10-13 ENCOUNTER — Other Ambulatory Visit: Payer: Self-pay

## 2019-10-13 ENCOUNTER — Emergency Department (HOSPITAL_BASED_OUTPATIENT_CLINIC_OR_DEPARTMENT_OTHER)
Admission: EM | Admit: 2019-10-13 | Discharge: 2019-10-13 | Disposition: A | Discharge status: Home / Self Care | Payer: Medicare Other | Attending: Emergency Medicine | Admitting: Emergency Medicine

## 2019-10-13 DIAGNOSIS — S299XXA Unspecified injury of thorax, initial encounter: Secondary | ICD-10-CM | POA: Diagnosis present

## 2019-10-13 DIAGNOSIS — X500XXA Overexertion from strenuous movement or load, initial encounter: Secondary | ICD-10-CM | POA: Insufficient documentation

## 2019-10-13 DIAGNOSIS — M542 Cervicalgia: Secondary | ICD-10-CM | POA: Insufficient documentation

## 2019-10-13 DIAGNOSIS — Y999 Unspecified external cause status: Secondary | ICD-10-CM | POA: Insufficient documentation

## 2019-10-13 DIAGNOSIS — Y9389 Activity, other specified: Secondary | ICD-10-CM | POA: Insufficient documentation

## 2019-10-13 DIAGNOSIS — S199XXA Unspecified injury of neck, initial encounter: Secondary | ICD-10-CM | POA: Insufficient documentation

## 2019-10-13 DIAGNOSIS — Y929 Unspecified place or not applicable: Secondary | ICD-10-CM | POA: Diagnosis not present

## 2019-10-13 DIAGNOSIS — S46819A Strain of other muscles, fascia and tendons at shoulder and upper arm level, unspecified arm, initial encounter: Secondary | ICD-10-CM

## 2019-10-13 DIAGNOSIS — S29012A Strain of muscle and tendon of back wall of thorax, initial encounter: Secondary | ICD-10-CM

## 2019-10-13 MED ORDER — MELOXICAM 7.5 MG PO TABS: 7.5000 mg | ORAL_TABLET | Freq: Every day | ORAL | 0 refills | Status: AC

## 2019-10-13 MED ORDER — CYCLOBENZAPRINE HCL 10 MG PO TABS: 10.0000 mg | ORAL_TABLET | Freq: Two times a day (BID) | ORAL | 0 refills | Status: AC | PRN

## 2019-10-13 MED FILL — MELOXICAM 7.5 MG TABLET: 7.5 | 10 days supply | Qty: 10 | Fill #0

## 2019-10-13 MED FILL — CYCLOBENZAPRINE HCL 10 MG T: 10 | 4 days supply | Qty: 8 | Fill #0

## 2019-10-13 NOTE — ED Triage Notes (Signed)
Pt states strained neck/back 2 weeks ago, taking otc medications with some temporary relief, but pain returns.  Last  Took alleve pm at midnight.

## 2019-10-13 NOTE — ED Notes (Signed)
Pt very tearful, stating difficult to find comfortable position while awaiting provider.

## 2019-10-13 NOTE — ED Provider Notes (Signed)
[facility] Provider Note   CSN: [id number] Arrival date & time: 10/13/19  9937     History Chief Complaint  Patient presents with  . Back Pain  . Neck Pain    Erica Morse is a 69 y.o. female.  69 year old female with past medical history including PE who presents with neck and upper back pain.  Approximately 2 weeks ago, patient was carrying some cans when someone stopped to chat with her.  She kept holding the cans instead of setting them down while talking to the percent for a while and then carried the cans inside.  Afterwards she began noticing some soreness in her bilateral upper back, posterior shoulders, and bilateral posterior neck.  The soreness has progressed since then and is worse with movement or raising her arms.  She has taken over-the-counter pain medications and tried heat therapy with only temporary relief.  Last medication was Aleve around midnight.  She denies any extremity weakness/numbness, fevers, recent illness, or trauma.  The history is provided by the patient.  Back Pain Neck Pain      Past Medical History:  Diagnosis Date  . PE (pulmonary embolism)     There are no problems to display for this patient.   Past Surgical History:  Procedure Laterality Date  . ABDOMINAL HYSTERECTOMY    . clot filter       OB History   No obstetric history on file.     No family history on file.  Social History   Tobacco Use  . Smoking status: Never Smoker  . Smokeless tobacco: Never Used  Substance Use Topics  . Alcohol use: No  . Drug use: No    Home Medications Prior to Admission medications   Medication Sig Start Date End Date Taking? Authorizing Provider  cyclobenzaprine (FLEXERIL) 10 MG tablet Take 1 tablet (10 mg total) by mouth 2 (two) times daily as needed for muscle spasms. 10/13/19   Anyssa Sharpless, Wenda Overland, MD  meloxicam (MOBIC) 7.5 MG tablet Take 1 tablet (7.5 mg total) by mouth daily. 10/13/19    Allure Greaser, Wenda Overland, MD  promethazine (PHENERGAN) 25 MG tablet Take 1 tablet (25 mg total) by mouth every 6 (six) hours as needed for nausea. 01/23/19   Molpus, John, MD    Allergies    Fosamax [alendronate sodium] and Tetracyclines & related  Review of Systems   Review of Systems  Musculoskeletal: Positive for back pain and neck pain.   All other systems reviewed and are negative except that which was mentioned in HPI  Physical Exam Updated Vital Signs BP 138/87 (BP Location: Left Arm)   Pulse 89   Temp 98.7 F (37.1 C) (Oral)   Resp 16   Ht 5' 5.5" (1.664 m)   Wt 86.2 kg   SpO2 100%   BMI 31.14 kg/m   Physical Exam Vitals and nursing note reviewed.  Constitutional:      General: She is not in acute distress.    Appearance: She is well-developed.  HENT:     Head: Normocephalic and atraumatic.  Eyes:     Conjunctiva/sclera: Conjunctivae normal.  Neck:     Comments: No midline spinal tenderness Pulmonary:     Effort: Pulmonary effort is normal.  Musculoskeletal:     Cervical back: Normal range of motion and neck supple. No rigidity.     Comments: Soreness to palpation b/l cervical and thoracic paraspinal muscles and trapezius  Skin:    General: Skin is  warm and dry.  Neurological:     Mental Status: She is alert and oriented to person, place, and time.     Sensory: No sensory deficit.     Motor: No weakness.     Comments: 5/5 strength BUE  Psychiatric:     Comments: Anxious, tearful     ED Results / Procedures / Treatments   Labs (all labs ordered are listed, but only abnormal results are displayed) Labs Reviewed - No data to display  EKG None  Radiology No results found.  Procedures Procedures (including critical care time)  Medications Ordered in ED Medications - No data to display  ED Course  I have reviewed the triage vital signs and the nursing notes.     MDM Rules/Calculators/A&P                      Sx and exam c/w muscle strain.  No red flag sx or neuro deficits and no trauma to suggest fracture, spinal problem, or artery dissection. No chest pain/anterior pain. Discussed supportive measures. Final Clinical Impression(s) / ED Diagnoses Final diagnoses:  Strain of thoracic back region  Strain of trapezius muscle, unspecified laterality, initial encounter    Rx / DC Orders ED Discharge Orders         Ordered    meloxicam (MOBIC) 7.5 MG tablet  Daily     10/13/19 1140    cyclobenzaprine (FLEXERIL) 10 MG tablet  2 times daily PRN     10/13/19 1140           Daichi Moris, Ambrose [place], MD 10/13/19 1146

## 2023-09-19 DIAGNOSIS — Z1159 Encounter for screening for other viral diseases: Secondary | ICD-10-CM | POA: Diagnosis not present

## 2023-09-19 DIAGNOSIS — E559 Vitamin D deficiency, unspecified: Secondary | ICD-10-CM | POA: Diagnosis not present

## 2023-09-19 DIAGNOSIS — Z7689 Persons encountering health services in other specified circumstances: Secondary | ICD-10-CM | POA: Diagnosis not present

## 2023-09-19 DIAGNOSIS — E119 Type 2 diabetes mellitus without complications: Secondary | ICD-10-CM | POA: Diagnosis not present

## 2023-09-26 DIAGNOSIS — E559 Vitamin D deficiency, unspecified: Secondary | ICD-10-CM | POA: Diagnosis not present

## 2023-09-26 DIAGNOSIS — Z1159 Encounter for screening for other viral diseases: Secondary | ICD-10-CM | POA: Diagnosis not present

## 2023-09-26 DIAGNOSIS — E119 Type 2 diabetes mellitus without complications: Secondary | ICD-10-CM | POA: Diagnosis not present

## 2023-09-26 DIAGNOSIS — Z1231 Encounter for screening mammogram for malignant neoplasm of breast: Secondary | ICD-10-CM | POA: Diagnosis not present

## 2023-09-26 DIAGNOSIS — Z803 Family history of malignant neoplasm of breast: Secondary | ICD-10-CM | POA: Diagnosis not present

## 2024-02-24 DIAGNOSIS — Z7689 Persons encountering health services in other specified circumstances: Secondary | ICD-10-CM | POA: Diagnosis not present

## 2024-02-24 DIAGNOSIS — E119 Type 2 diabetes mellitus without complications: Secondary | ICD-10-CM | POA: Diagnosis not present

## 2024-02-24 DIAGNOSIS — E559 Vitamin D deficiency, unspecified: Secondary | ICD-10-CM | POA: Diagnosis not present

## 2024-02-24 DIAGNOSIS — Z0000 Encounter for general adult medical examination without abnormal findings: Secondary | ICD-10-CM | POA: Diagnosis not present

## 2024-02-24 DIAGNOSIS — E782 Mixed hyperlipidemia: Secondary | ICD-10-CM | POA: Diagnosis not present

## 2024-04-02 DIAGNOSIS — H5203 Hypermetropia, bilateral: Secondary | ICD-10-CM | POA: Diagnosis not present

## 2024-07-23 NOTE — Progress Notes (Signed)
 Erica Morse                                          MRN: [id number]   07/23/2024   The [facility] Quality Team Specialist reviewed this patient medical record for the purposes of chart review for care gap closure. The following were reviewed: chart review for care gap closure-glycemic status assessment.    [facility] Quality Team
